# Patient Record
Sex: Male | Born: 1950 | Race: White | Hispanic: No | Marital: Married | Smoking: Current every day smoker
Health system: Southern US, Community
[De-identification: ages and names within clinical notes are randomized; demographics above are authoritative.]

## PROBLEM LIST (undated history)

## (undated) DIAGNOSIS — C801 Malignant (primary) neoplasm, unspecified: Secondary | ICD-10-CM

## (undated) DIAGNOSIS — N200 Calculus of kidney: Secondary | ICD-10-CM

## (undated) DIAGNOSIS — I10 Essential (primary) hypertension: Secondary | ICD-10-CM

## (undated) DIAGNOSIS — I252 Old myocardial infarction: Secondary | ICD-10-CM

## (undated) HISTORY — PX: CORONARY ANGIOPLASTY WITH STENT PLACEMENT: SHX49

## (undated) HISTORY — PX: PARATHYROIDECTOMY: SHX19

---

## 2016-12-12 ENCOUNTER — Emergency Department
Admission: EM | Admit: 2016-12-12 | Discharge: 2016-12-12 | Disposition: A | Discharge status: Home / Self Care | Payer: Medicare Other | Attending: Emergency Medicine | Admitting: Emergency Medicine

## 2016-12-12 ENCOUNTER — Encounter: Payer: Self-pay | Admitting: Emergency Medicine

## 2016-12-12 ENCOUNTER — Emergency Department: Payer: Medicare Other

## 2016-12-12 DIAGNOSIS — Z85820 Personal history of malignant melanoma of skin: Secondary | ICD-10-CM | POA: Insufficient documentation

## 2016-12-12 DIAGNOSIS — I252 Old myocardial infarction: Secondary | ICD-10-CM | POA: Insufficient documentation

## 2016-12-12 DIAGNOSIS — F17210 Nicotine dependence, cigarettes, uncomplicated: Secondary | ICD-10-CM | POA: Insufficient documentation

## 2016-12-12 DIAGNOSIS — N23 Unspecified renal colic: Secondary | ICD-10-CM

## 2016-12-12 DIAGNOSIS — Z85028 Personal history of other malignant neoplasm of stomach: Secondary | ICD-10-CM | POA: Diagnosis not present

## 2016-12-12 DIAGNOSIS — I10 Essential (primary) hypertension: Secondary | ICD-10-CM | POA: Diagnosis not present

## 2016-12-12 DIAGNOSIS — R1032 Left lower quadrant pain: Secondary | ICD-10-CM | POA: Diagnosis present

## 2016-12-12 HISTORY — DX: Essential (primary) hypertension: I10

## 2016-12-12 HISTORY — DX: Calculus of kidney: N20.0

## 2016-12-12 HISTORY — DX: Malignant (primary) neoplasm, unspecified: C80.1

## 2016-12-12 HISTORY — DX: Old myocardial infarction: I25.2

## 2016-12-12 LAB — URINALYSIS, COMPLETE (UACMP) WITH MICROSCOPIC
Bilirubin Urine: NEGATIVE
Glucose, UA: NEGATIVE mg/dL
Ketones, ur: NEGATIVE mg/dL
LEUKOCYTES UA: NEGATIVE
Nitrite: NEGATIVE
PROTEIN: NEGATIVE mg/dL
SPECIFIC GRAVITY, URINE: 1.015 (ref 1.005–1.030)
pH: 6 (ref 5.0–8.0)

## 2016-12-12 LAB — COMPREHENSIVE METABOLIC PANEL
ALK PHOS: 68 U/L (ref 38–126)
ALT: 19 U/L (ref 17–63)
AST: 20 U/L (ref 15–41)
Albumin: 4 g/dL (ref 3.5–5.0)
Anion gap: 5 (ref 5–15)
BUN: 17 mg/dL (ref 6–20)
CALCIUM: 8.8 mg/dL — AB (ref 8.9–10.3)
CHLORIDE: 108 mmol/L (ref 101–111)
CO2: 26 mmol/L (ref 22–32)
CREATININE: 0.86 mg/dL (ref 0.61–1.24)
GFR calc non Af Amer: >60 mL/min (ref >60)
GLUCOSE: 118 mg/dL — AB (ref 65–99)
Potassium: 4 mmol/L (ref 3.5–5.1)
SODIUM: 139 mmol/L (ref 135–145)
Total Bilirubin: 0.8 mg/dL (ref 0.3–1.2)
Total Protein: 6.6 g/dL (ref 6.5–8.1)

## 2016-12-12 LAB — CBC
HEMATOCRIT: 51.1 % (ref 40.0–52.0)
Hemoglobin: 17.3 g/dL (ref 13.0–18.0)
MCH: 29.6 pg (ref 26.0–34.0)
MCHC: 34 g/dL (ref 32.0–36.0)
MCV: 87.1 fL (ref 80.0–100.0)
PLATELETS: 224 10*3/uL (ref 150–440)
RBC: 5.86 MIL/uL (ref 4.40–5.90)
RDW: 14.5 % (ref 11.5–14.5)
WBC: 7.2 10*3/uL (ref 3.8–10.6)

## 2016-12-12 MED ORDER — TAMSULOSIN HCL 0.4 MG PO CAPS: 0.4000 mg | ORAL_CAPSULE | Freq: Every day | ORAL | 0 refills | Status: AC

## 2016-12-12 MED ORDER — OXYCODONE-ACETAMINOPHEN 7.5-325 MG PO TABS: 1.0000 | ORAL_TABLET | ORAL | 0 refills | Status: AC | PRN

## 2016-12-12 MED ORDER — ONDANSETRON 4 MG PO TBDP: 4.0000 mg | ORAL_TABLET | Freq: Three times a day (TID) | ORAL | 0 refills | Status: AC | PRN

## 2016-12-12 MED ORDER — MORPHINE SULFATE (PF) 4 MG/ML IV SOLN
4.0000 mg | Freq: Once | INTRAVENOUS | Status: AC
  Administered 2016-12-12: 4 mg via INTRAVENOUS
  Filled 2016-12-12: qty 1

## 2016-12-12 MED ORDER — KETOROLAC TROMETHAMINE 30 MG/ML IJ SOLN
15.0000 mg | Freq: Once | INTRAMUSCULAR | Status: AC
  Administered 2016-12-12: 15 mg via INTRAVENOUS
  Filled 2016-12-12: qty 1

## 2016-12-12 MED ORDER — DOCUSATE SODIUM 100 MG PO CAPS: 100.0000 mg | ORAL_CAPSULE | Freq: Every day | ORAL | 2 refills | Status: AC | PRN

## 2016-12-12 NOTE — ED Notes (Signed)
First Nurse: pt ambulatory to stat desk with c/o not feeling well and not able to have a BM in one week. Pt also states has a kidney stone currently.

## 2016-12-12 NOTE — ED Triage Notes (Signed)
States L flank pain x 5 days. Has sensation of pressure to urinate constantly. States also decreased bowel movements x 1 week with feeling of distention. Denies fever. Nausea without vomiting.

## 2016-12-12 NOTE — ED Notes (Signed)
Patient transported to X-ray 

## 2016-12-12 NOTE — ED Provider Notes (Addendum)
[facility] Emergency Department Provider Note       Time seen: ----------------------------------------- 9:27 AM on 12/12/2016 -----------------------------------------     I have reviewed the triage vital signs and the nursing notes.   HISTORY   Chief Complaint Flank Pain    HPI Bobby Evans is a 66 y.o. male who presents to the ED for left sided back pain for the last 5 days. Patient has had a sensation of pressure to urinate constantly with decreased bowel movements over the last week as well. Patient feels distended in the abdomen, he denies fevers, chills, chest pain, shortness of breath, vomiting or diarrhea. He has had nausea.   Past Medical History:  Diagnosis Date  . Cancer ([place])    stomache cancer and melanoma  . Hypertension   . Kidney stones   . MI, old     There are no active problems to display for this patient.   Past Surgical History:  Procedure Laterality Date  . CORONARY ANGIOPLASTY WITH STENT PLACEMENT    . PARATHYROIDECTOMY      Allergies Keflex [cephalexin]; Codeine; and Protonix [pantoprazole sodium]  Social History Social History  Substance Use Topics  . Smoking status: Current Every Day Smoker    Packs/day: 0.50    Types: Cigarettes  . Smokeless tobacco: Not on file  . Alcohol use Not on file    Review of Systems Constitutional: Negative for fever. Cardiovascular: Negative for chest pain. Respiratory: Negative for shortness of breath. Gastrointestinal: Positive for abdominal pain, nausea Genitourinary: Negative for dysuria. Musculoskeletal: Negative for back pain. Skin: Negative for rash. Neurological: Negative for headaches, focal weakness or numbness.  All systems negative/normal/unremarkable except as stated in the HPI  ____________________________________________   PHYSICAL EXAM:  VITAL SIGNS: ED Triage Vitals  Enc Vitals Group     BP 12/12/16 0821 (!) 157/92     Pulse Rate 12/12/16  0821 74     Resp 12/12/16 0821 20     Temp 12/12/16 0821 97.7 F (36.5 C)     Temp Source 12/12/16 0821 Oral     SpO2 12/12/16 0821 97 %     Weight 12/12/16 0822 165 lb (74.8 kg)     Height 12/12/16 0822 5\' 6"  (1.676 m)     Head Circumference --      Peak Flow --      Pain Score 12/12/16 0821 8     Pain Loc --      Pain Edu? --      Excl. in [place]? --     Constitutional: Alert and oriented. Well appearing and in no distress. Eyes: Conjunctivae are normal. Normal extraocular movements. ENT   Head: Normocephalic and atraumatic.   Nose: No congestion/rhinnorhea.   Mouth/Throat: Mucous membranes are moist.   Neck: No stridor. Cardiovascular: Normal rate, regular rhythm. No murmurs, rubs, or gallops. Respiratory: Normal respiratory effort without tachypnea nor retractions. Breath sounds are clear and equal bilaterally. No wheezes/rales/rhonchi. Gastrointestinal: Left flank tenderness, no rebound or guarding. Normal bowel sounds. Musculoskeletal: Nontender with normal range of motion in extremities. No lower extremity tenderness nor edema. Neurologic:  Normal speech and language. No gross focal neurologic deficits are appreciated.  Skin:  Skin is warm, dry and intact. No rash noted. Psychiatric: Mood and affect are normal. Speech and behavior are normal.  ____________________________________________  ED COURSE:  Pertinent labs & imaging results that were available during my care of the patient were reviewed by me and considered in my medical  decision making (see chart for details). Patient presents for flank pain and likely renal colic, we will assess with labs and imaging as indicated.   Procedures ____________________________________________   LABS (pertinent positives/negatives)  Labs Reviewed  URINALYSIS, COMPLETE (UACMP) WITH MICROSCOPIC - Abnormal; Notable for the following:       Result Value   Color, Urine YELLOW (*)    APPearance HAZY (*)    Hgb urine  dipstick MODERATE (*)    Bacteria, UA RARE (*)    Squamous Epithelial / LPF 0-5 (*)    All other components within normal limits  COMPREHENSIVE METABOLIC PANEL - Abnormal; Notable for the following:    Glucose, Bld 118 (*)    Calcium 8.8 (*)    All other components within normal limits  CBC    RADIOLOGY Images were viewed by me  Ultrasound renal, abdomen 2 view IMPRESSION: Moderate left hydroureteronephrosis. 8 mm stone previously seen by radiography not seen by the ultrasound technologist. No left ureteral jet. Findings consistent with obstructing left ureteral stone. Specific location not determined.  No hydronephrosis on the right. Possible 3 mm nonobstructing stone midportion. ____________________________________________  FINAL ASSESSMENT AND PLAN  Renal colic  Plan: Patient's labs and imaging were dictated above. Patient had presented for Flank pain which is apparently from likely obstructing distal ureteral stone. I have discussed with urology who feels like the pain is coming from a distal left stone. He'll be discharged with Flomax, pain medicine and antiemetics.   Earleen Newport, MD   Note: This note was generated in part or whole with voice recognition software. Voice recognition is usually quite accurate but there are transcription errors that can and very often do occur. I apologize for any typographical errors that were not detected and corrected.     Earleen Newport, MD 12/12/16 1105    Earleen Newport, MD 12/12/16 [phone]

## 2018-12-01 IMAGING — CR DG ABDOMEN 2V
2 series · 2 of 2 positions shown · non-contrast
Comparison: No recent .

CLINICAL DATA: Left flank pain.

EXAM:
ABDOMEN - 2 VIEW

[abdomen erect]
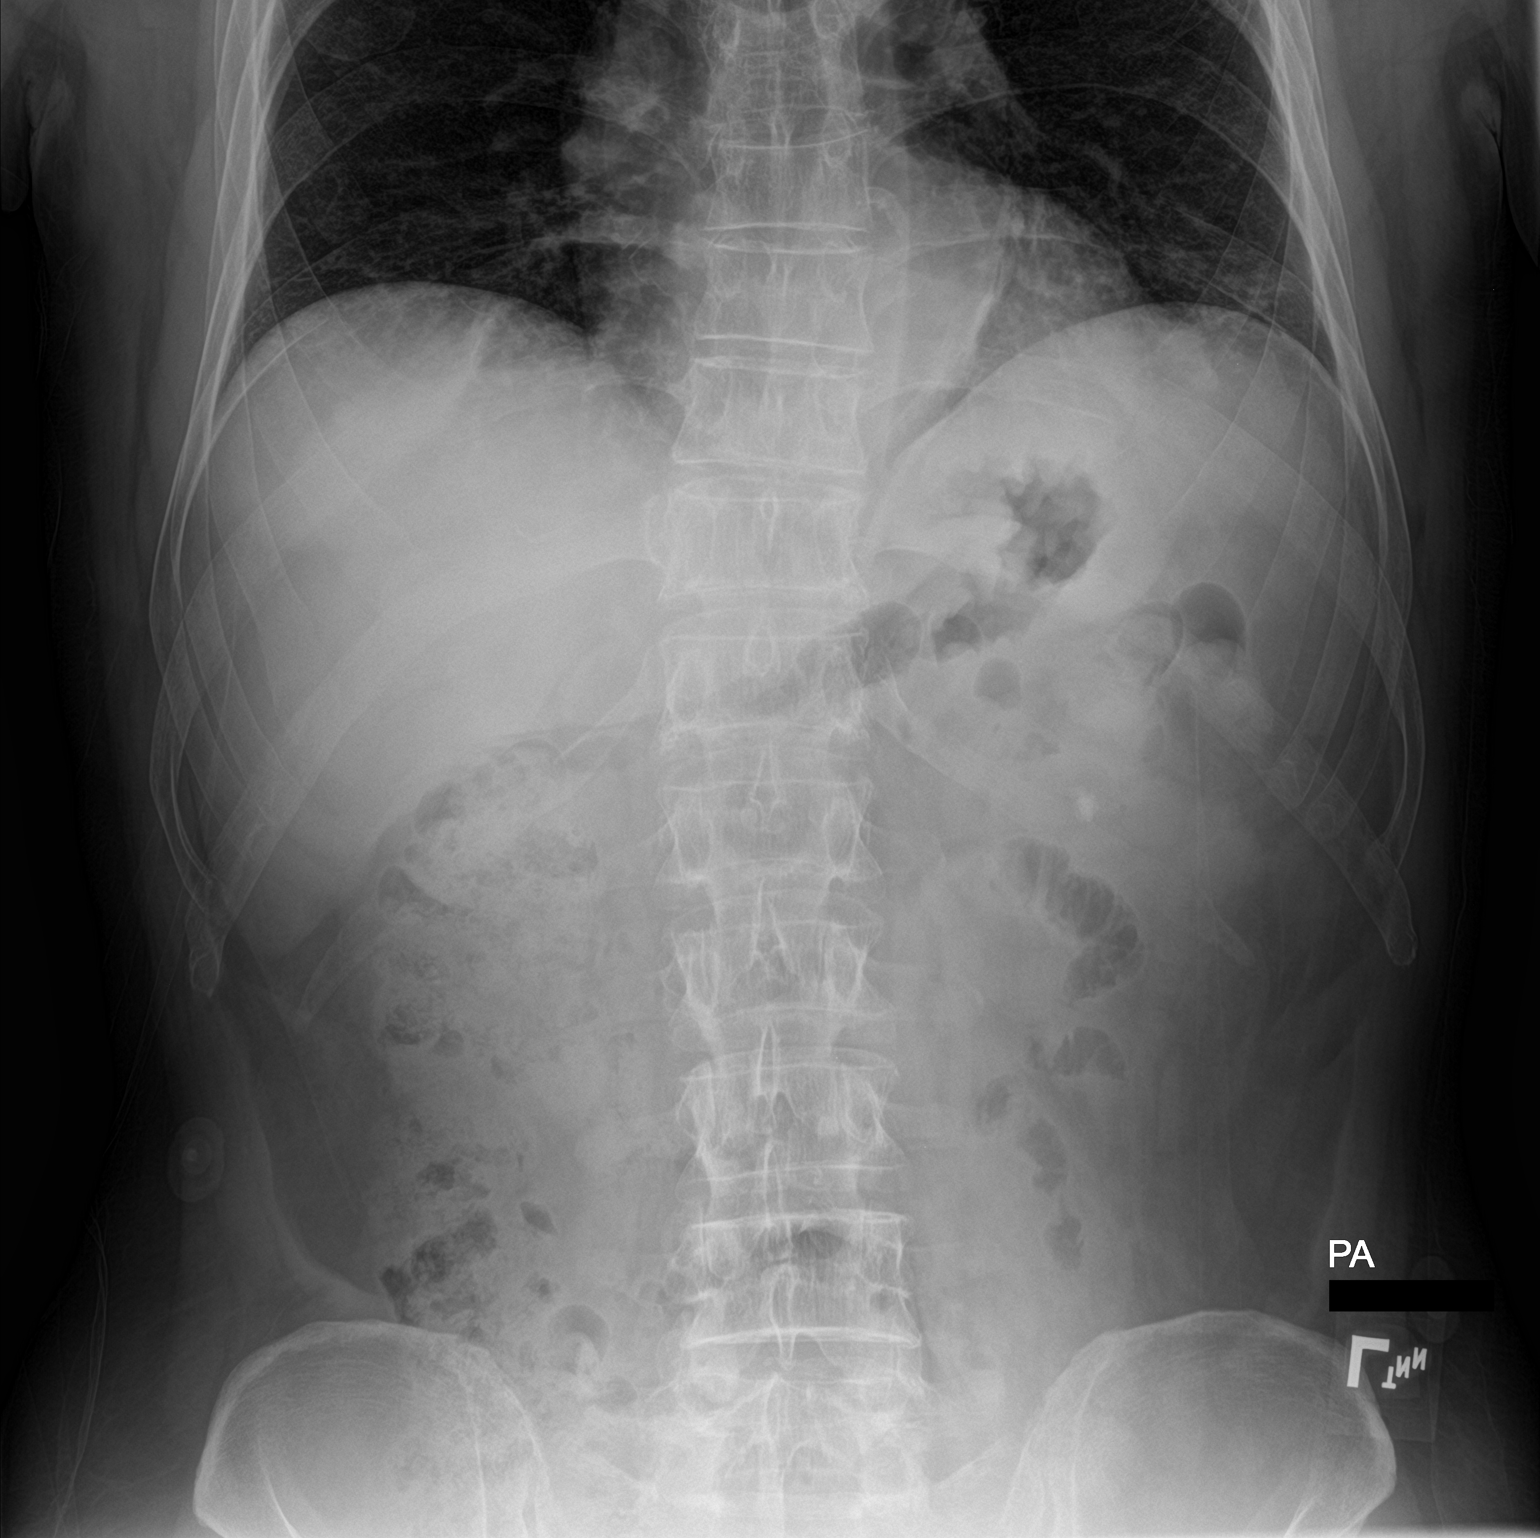

[abdomen supine]
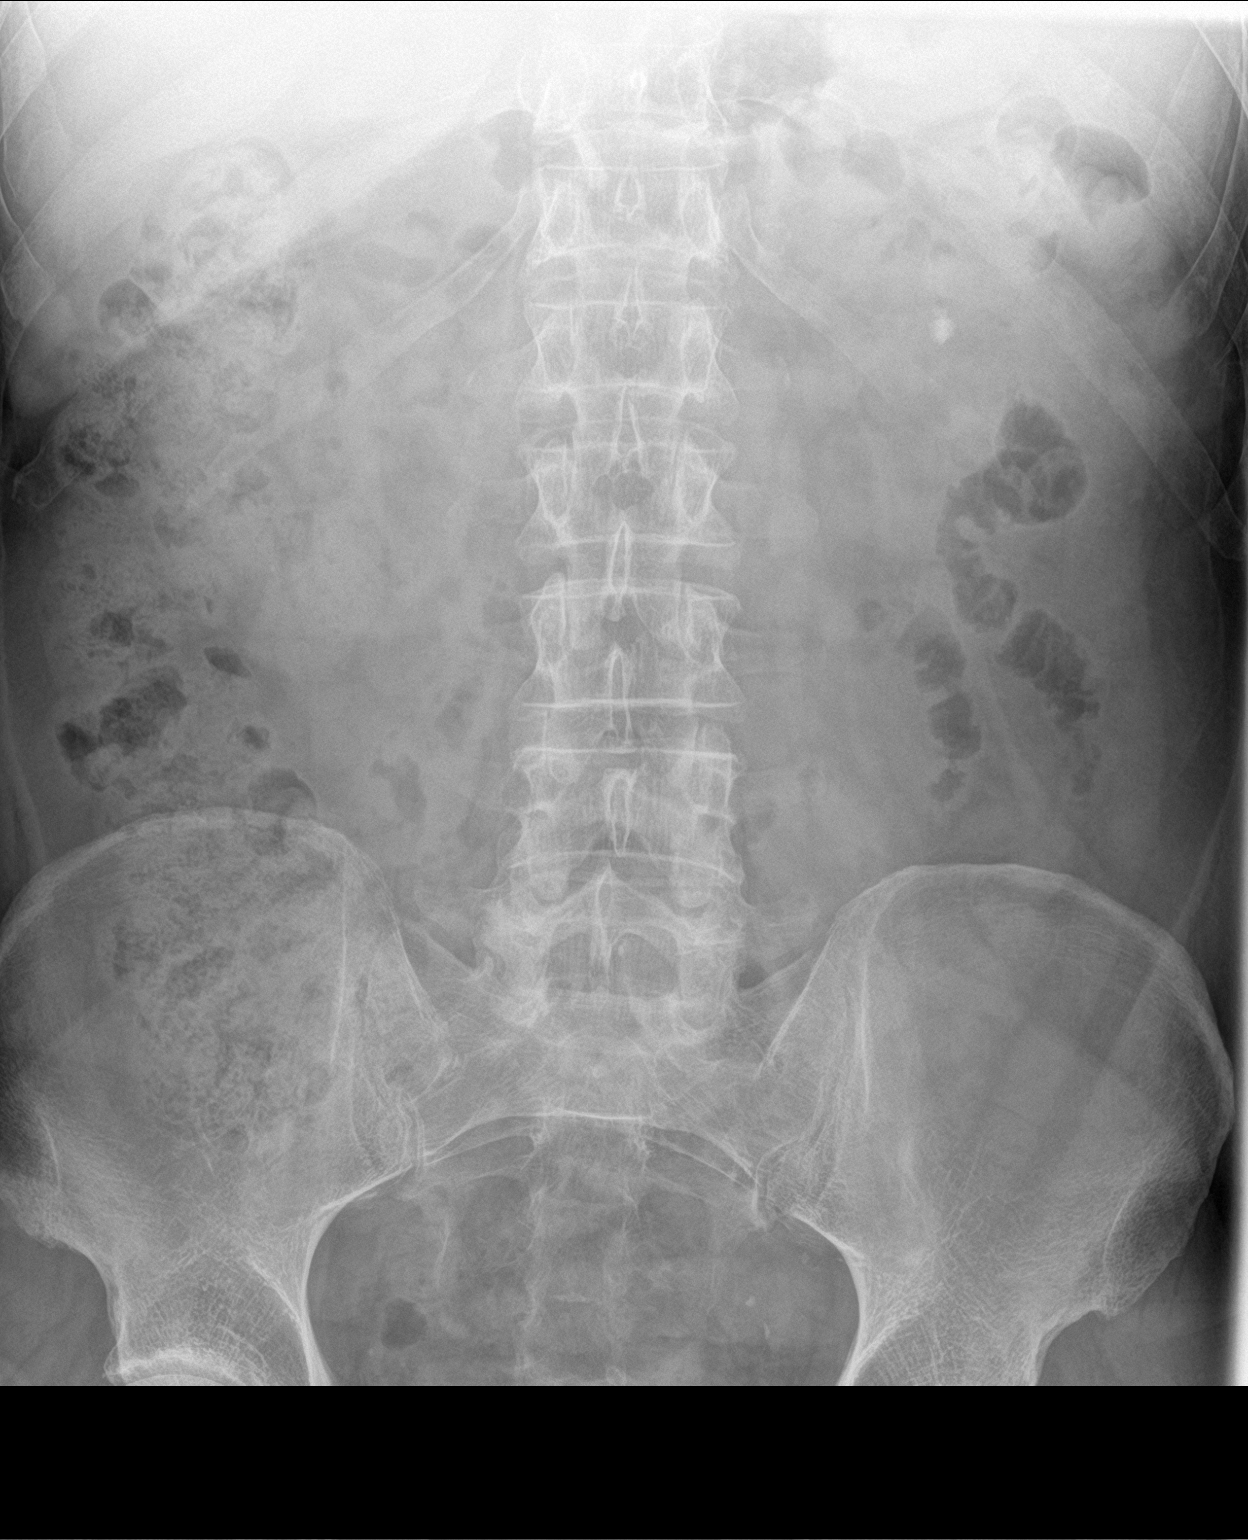

[2 of 2 positions shown; findings below may reference images not displayed]

FINDINGS: Soft tissue structures are unremarkable. 8 mm is calcifications left
upper abdomen consistent left nephrolithiasis . Multiple
calcifications in the pelvis. Although these may represent
phleboliths a distal ureteral stone cannot be excluded. No bowel
distention. Stool noted throughout the colon.
IMPRESSION: 1. 8 mm left nephrolithiasis. Multiple calcifications in the pelvis.
Although these may represent phleboliths a distal ureteral stone
cannot be excluded.

2. No acute or focal abnormality otherwise noted.

## 2019-01-18 IMAGING — US US RENAL
1 series · 14 of 25 positions shown · non-contrast
Comparison: Abdominal radiography same day

CLINICAL DATA: Left flank pain over the last 5 days. History of
kidney stones with lithotripsy.

EXAM:
RENAL / URINARY TRACT ULTRASOUND COMPLETE

[Series 1: us renal · 0.23mm/px · 14 of 58 slices shown]
[im 1/58]
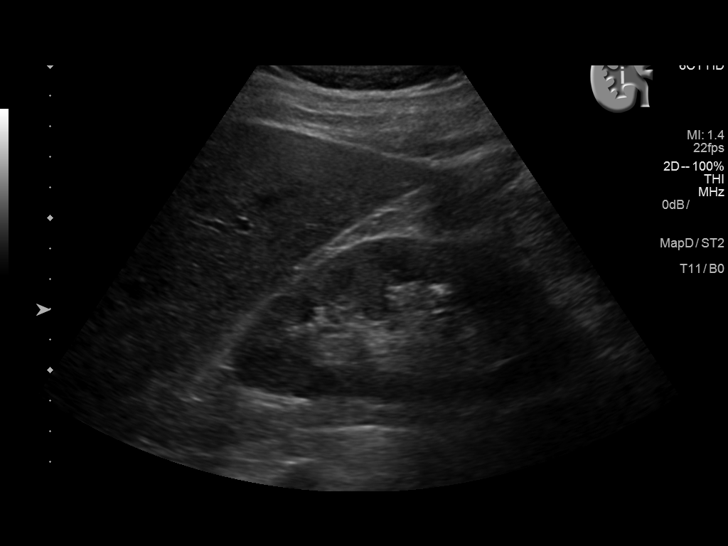
[im 5/58]
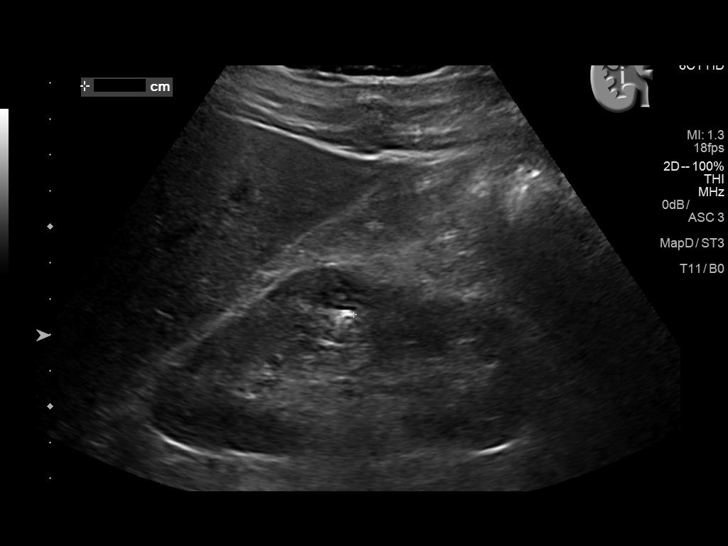
[im 10/58]
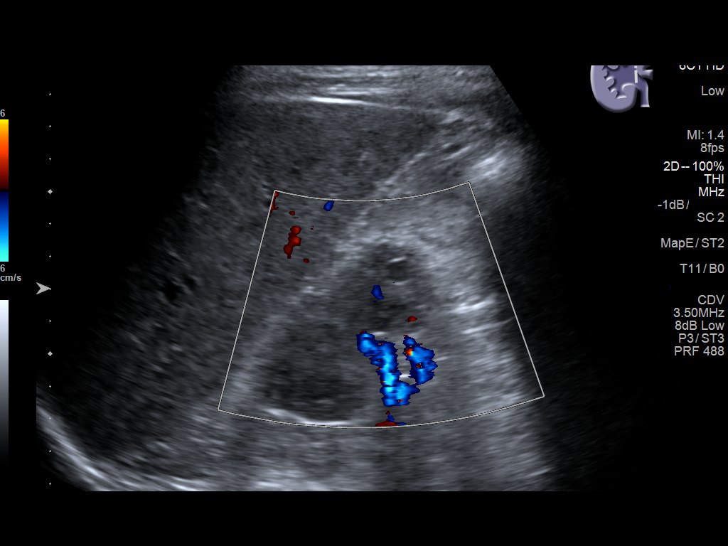
[im 15/58]
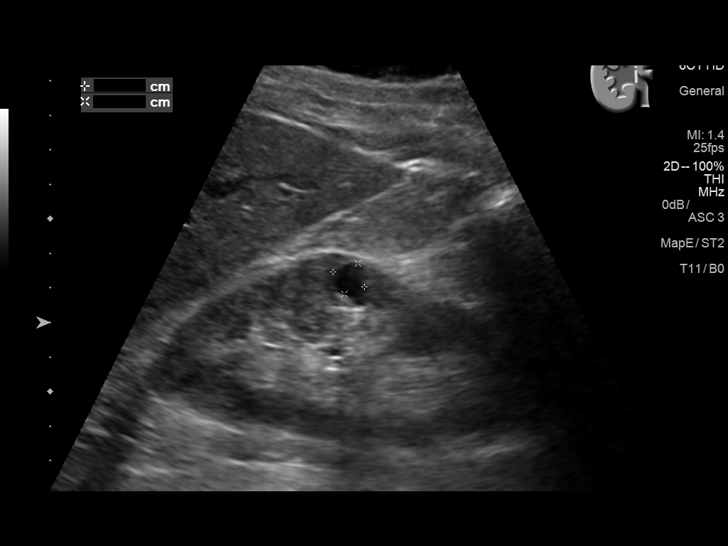
[im 20/58]
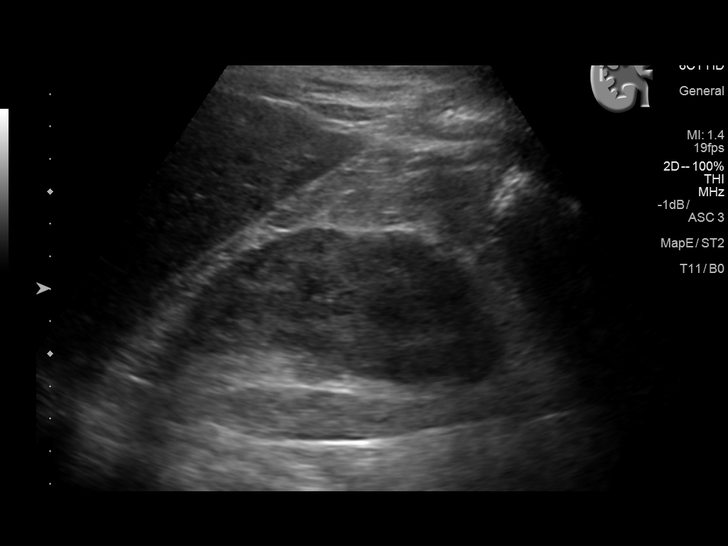
[im 22/58]
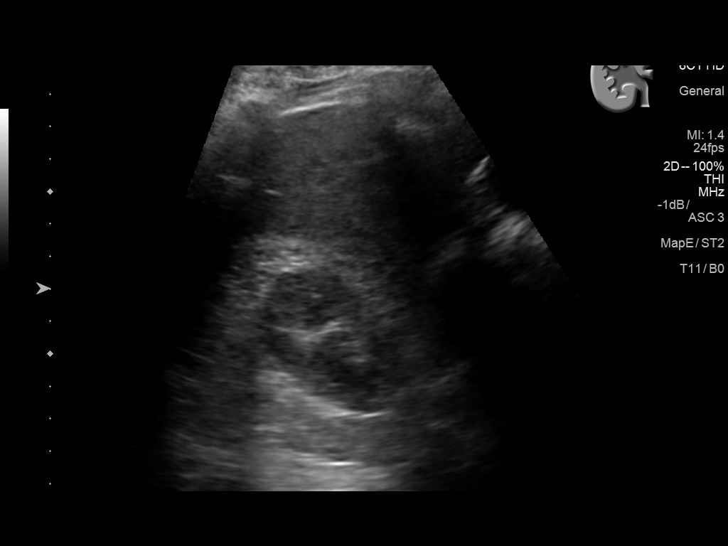
[im 27/58]
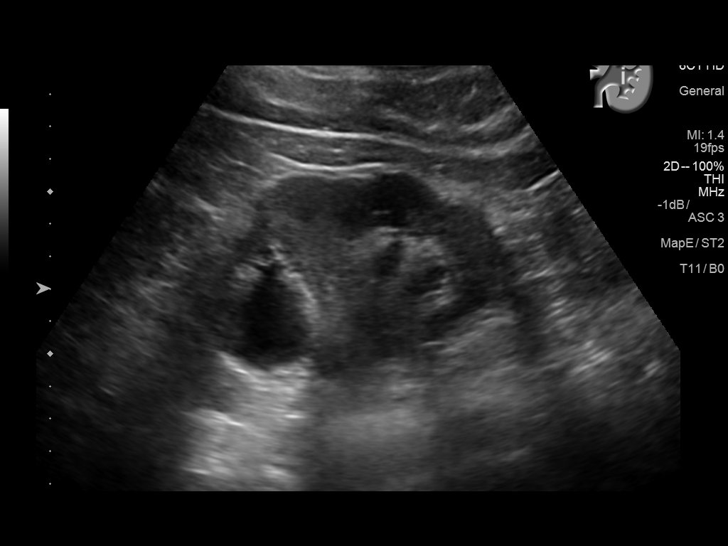
[im 31/58]
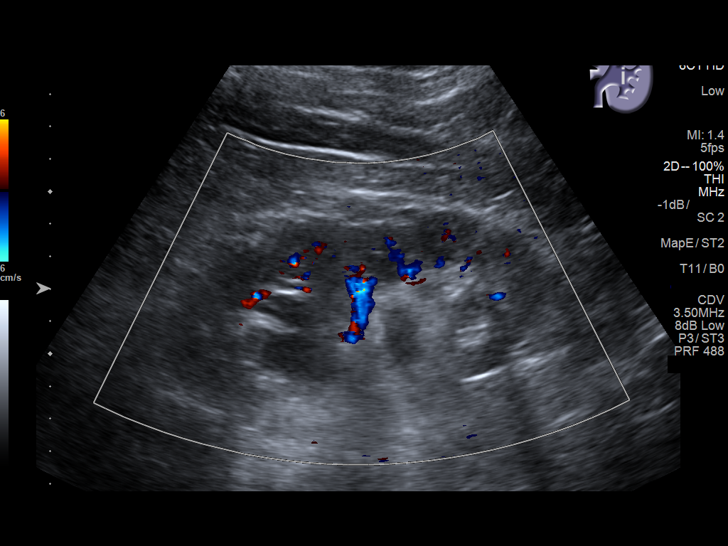
[im 36/58]
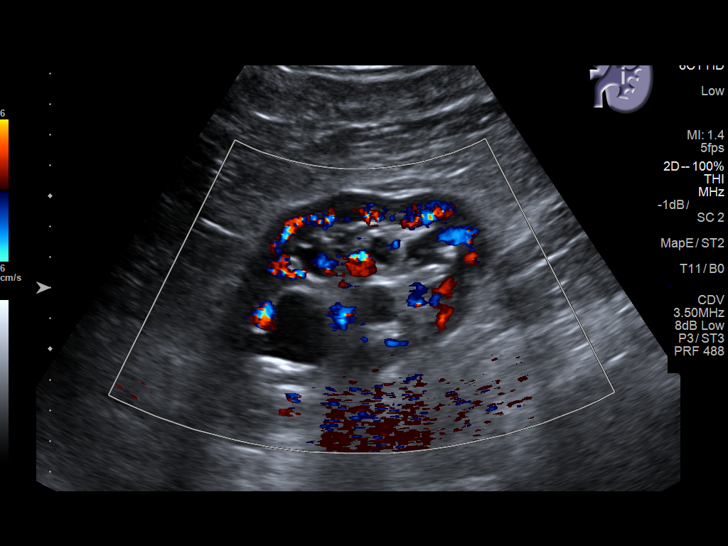
[im 39/58]
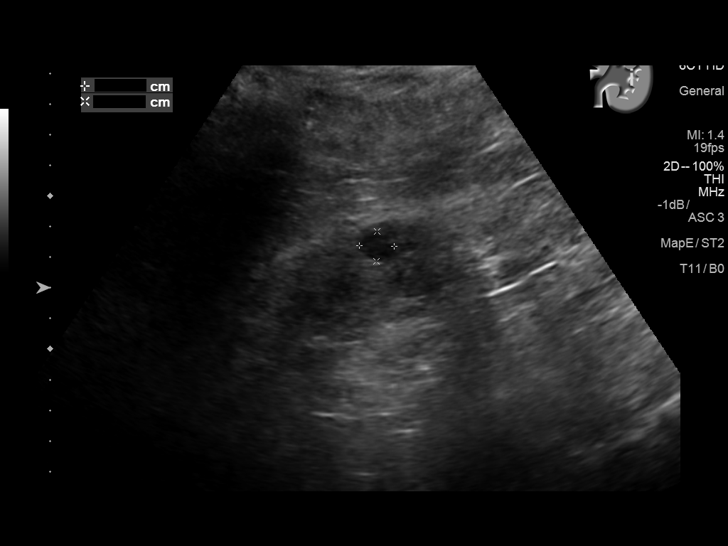
[im 43/58]
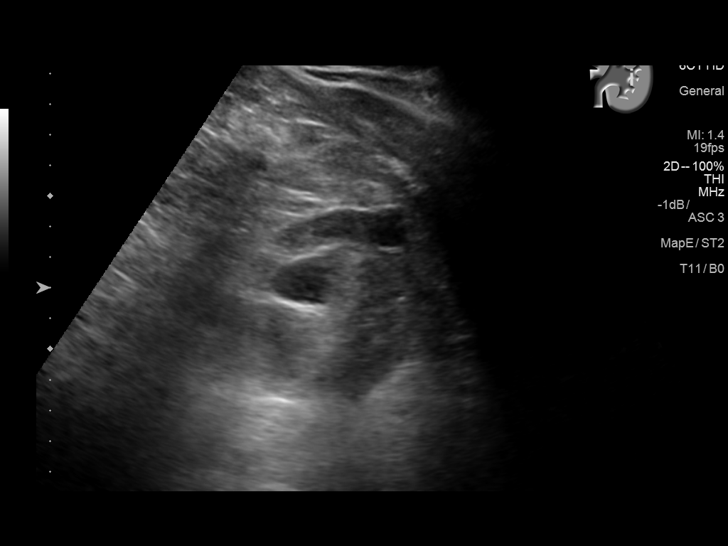
[im 48/58]
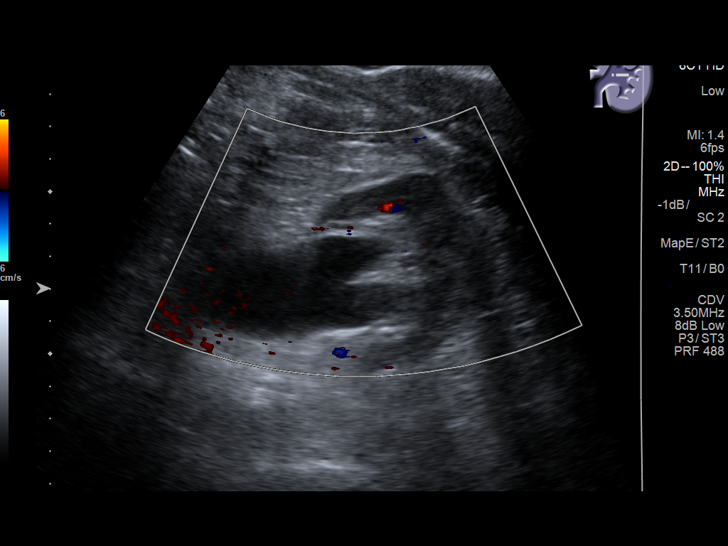
[im 53/58]
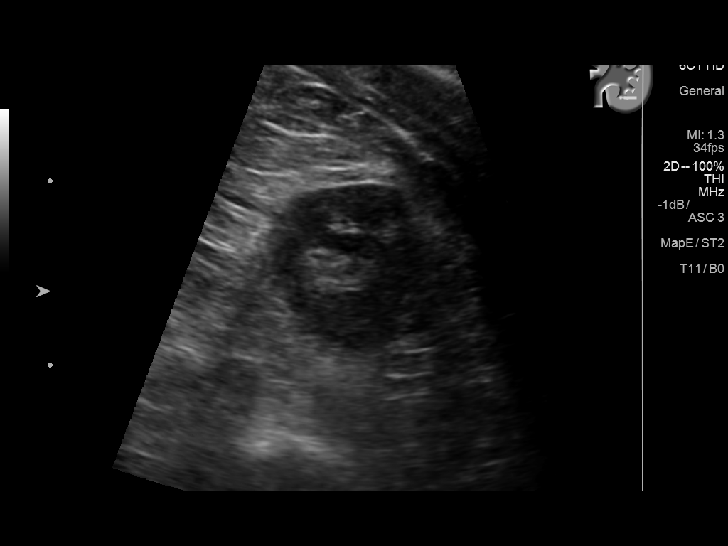
[im 58/58]
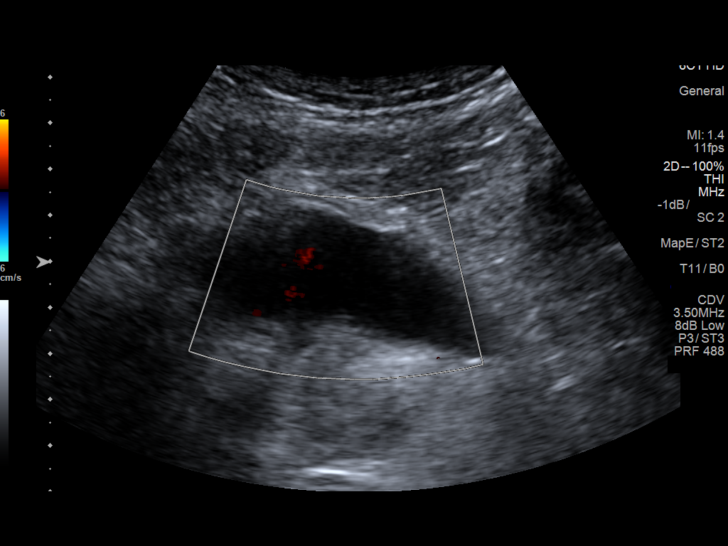

[14 of 25 positions shown; findings below may reference images not displayed]

FINDINGS: Right Kidney:

Length: 11.8 cm.. Suspicion of 3 mm stone in the midportion. No
hydronephrosis. Two small benign appearing cysts.

Left Kidney:

Length: 11.5 cm. Moderate left hydroureteronephrosis. Technologist
did not identified the previously seen 8 mm left renal stone.
Incidental 1 cm cyst.

Bladder:

Normal right ureteral jet.  No left ureteral jet.
IMPRESSION: Moderate left hydroureteronephrosis. 8 mm stone previously seen by
radiography not seen by the ultrasound technologist. No left
ureteral jet. Findings consistent with obstructing left ureteral
stone. Specific location not determined.

No hydronephrosis on the right. Possible 3 mm nonobstructing stone
midportion.

## 2022-05-26 NOTE — Progress Notes (Signed)
 [facility] PATIENT VISIT  Date of Encounter: 05/26/2022 Patient Name: Bobby Evans 04-09-1951 71 y.o.  Primary Care Provider:  Gary Levander Shaggy, MD Primary Cardiologist: Dr. Vannie  Reason for visit: Bobby Evans  is a 71 y.o. male seen today in clinic for intermittent chest pain  History of Present Illness Bobby Evans is a 71 y.o. male with pmhx significant for CAD, HTN.  Pt was last evaluated by Dr. Vannie 04/07/2022.  He presents to clinic today with concerns of intermittent chest pain described as left sided, then bilateral chest pain, rating it at a 5-6/10 at its worst, that began on Saturday.  He reported that morning he awoke feeling off. Later that day, he began experiencing chest discomfort.  He states his symptoms continued on Sunday, taking Mylanta, without improvement, but lying flat to rest improved his symptoms.  He also noted upper posterior shoulder area tightness, but denies tenderness to touch.  Monday his symptoms again worsened throughout the day.  He went grocery shopping and while walking around, he became lightheaded.  He returned home, noting his blood pressure was low 93/32mmHg. His symptoms improved with lying down. Upon waking Wednesday, his symptoms improved, but continued to experience intermittent chest pain.  Today, he overall feels well.   He did not engage in routine activity, like he normally does, but he didn't feel well.  He reported another brief episode of chest pain on his drive here, that self resolved.  Denies further episodes of lightheadedness.  Denies recent illnesses. He denies shortness of breath/DOE. He denies active chest pain/pressure/tightness.    Denies orthopnea/PND, pedal edema, palpitations, further episodes of lightheadedness or dizziness, or syncope/near syncope, fevers, chills, muscle aches, nausea, vomiting. EKG and echo completed today, echo pending.  EKG demosntrated LAFB, NSR, as fully outlined below.      Accessory Clinical Findings  I independently reviewed last cardiology note.  Past Medical History  Past Medical History:  Diagnosis Date  . Aneurysm, ascending aorta (CMS-HCC)   . Anxiety   . Cancer (CMS-HCC) 2013 & 2014  . Diabetes mellitus (CMS-HCC)   . Disease of thyroid gland   . GERD (gastroesophageal reflux disease)   . Hyperlipidemia   . Hypertension   . Mini stroke   . Myocardial infarction (CMS-HCC)   . Peptic ulceration   . Stroke (CMS-HCC)    Past Surgical History:  Procedure Laterality Date  . COLONOSCOPY    . CORONARY STENT PLACEMENT    . FRACTURE SURGERY    . kidney stones    . KNEE SURGERY Bilateral   . parathyroid disease    . right hand reconstruction    . stomach cancer    . thyroid nodules    . US  CYST ASPIR OF KIDNEY BILAT ([facility] HISTORICAL RESULT)    . widow maker      Current Allergy List Allergies  Allergen Reactions  . Aspirin Hives and Nausea And Vomiting    Large   doses of aspirin cause severe GERD. Large   doses of aspirin cause severe GERD. NON COATED  . Protonix [Pantoprazole] Hives  . Chlorthalidone Itching  . Codeine     hyperactivity  . Eliquis [Apixaban]   . Keflex [Cephalexin]   . Lactose   . Pollen Extracts     Other reaction(s): Unknown    Current Medication List Current Outpatient Medications  Medication Sig Dispense Refill  . ALPRAZolam (XANAX) 0.5 MG tablet Take 1 tablet (0.5 mg total) by mouth Three (  3) times a day as needed. Pt taking 1/2 QAM    . aspirin (ECOTRIN) 81 MG tablet Take 1 tablet (81 mg total) by mouth daily.    SABRA atorvastatin (LIPITOR) 80 MG tablet TAKE 1 TABLET BY MOUTH DAILY (Patient taking differently: 1 tablet (80 mg total)  in the morning.) 90 tablet 1  . famotidine (PEPCID) 40 MG tablet Take 1 tablet (40 mg total) by mouth nightly as needed.    . fluticasone propionate (FLONASE) 50 mcg/actuation nasal spray use TWO SPRAYS in BOTH nostrils ONCE a DAY    . HYDROmorphone (DILAUDID) 2 MG tablet  PRN    . KRILL OIL ORAL Take by mouth.    . losartan (COZAAR) 25 MG tablet Take 1 tablet (25 mg total) by mouth Two (2) times a day. 180 tablet 3  . metoPROLOL succinate (TOPROL XL) 50 MG 24 hr tablet Take 1 tablet (50 mg total) by mouth every morning. 30 tablet 6  . metoprolol tartrate (LOPRESSOR) 25 MG tablet 25 mg as needed for pvcs    . nitroglycerin (NITROSTAT) 0.4 MG SL tablet Place 1 tablet (0.4 mg total) under the tongue every five (5) minutes as needed for chest pain.    . RABEprazole (ACIPHEX) 20 mg tablet Take 1 tablet (20 mg total) by mouth daily.    . rivaroxaban (XARELTO) 10 mg tablet Take 1 tablet (10 mg total) by mouth daily.    . sucralfate (CARAFATE) 1 gram tablet Take 1 tablet (1 g total) by mouth four (4) times a day. PRN    . tamsulosin  (FLOMAX ) 0.4 mg capsule take 1 capsule daily FOR 7 DAYS    . verapamil HCl (VERAPAMIL ORAL) Take 120 mg by mouth nightly. ER    . azelastine (ASTELIN) 137 mcg (0.1 %) nasal spray administer 2 SPRAYS into each nostril ONCE a DAY    . coenzyme Q10 100 mg capsule Take 2 capsules (200 mg total) by mouth daily.    . ondansetron  (ZOFRAN ) 4 MG tablet ondansetron  HCl 4 mg tablet  TAKE ONE TABLET BY MOUTH THREE TIMES DAILY    . potassium citrate 15 mEq TbER TAKE ONE TABLET ONCE DAILY     No current facility-administered medications for this visit.    Family History  Family History  Problem Relation Age of Onset  . Breast cancer Mother   . Cancer Mother   . Diabetes Mother   . Heart attack Father   . Arthritis Father   . Heart disease Father   . Hyperlipidemia Father   . Hypertension Father   . Vision loss Father   . Angina Father   . GER disease Father   . Heart disease Brother   . Hyperlipidemia Brother   . Hypertension Brother   . Angina Brother   . GER disease Brother   . Hyperlipidemia Sister   . GER disease Sister   . Hyperlipidemia Brother   . GER disease Sister   . Hypertension Sister   . Cancer Sister   . Breast cancer  Child     Social History  Social History   Socioeconomic History  . Marital status: Married    Spouse name: Louanne Hannan   . Number of children: 2  . Years of education: post graduate   Occupational History  . Occupation: retired  Tobacco Use  . Smoking status: Heavy Smoker    Current packs/day: 0.50    Average packs/day: 0.5 packs/day for 50.0 years (25.0 ttl pk-yrs)  Types: Cigarettes  . Smokeless tobacco: Never  . Tobacco comments:    half pack daily  Substance and Sexual Activity  . Alcohol use: Never    Alcohol/week: 1.0 standard drink of alcohol    Types: 1 Shots of liquor per week    Comment: socially  . Drug use: Never  . Sexual activity: Yes    Partners: Female    Review of Systems Except as noted in the history of present of illness, all other systems are negative.  Physical Exam Blood pressure 118/76, pulse 65, resp. rate 20, height 170.2 cm (5' 7), weight 72.6 kg (160 lb), SpO2 95 %.  Constitutional: Well developed, well nourished, in no acute distress Neurologic/ Psychiatric: Alert and oriented X 3. Mood and affect appropriate. HEENT: Normocephalic, atraumatic, carotids without bruit or thrill Respiratory: CTAB, diminished bases, no wheeze/rhonchi/crackles Cardiovascular: RRR, S1S2  Extremities and distal pulses: No peripheral edema.  2+ radial/DP and symmetrical upper extremities GI: s/nt/nd, BS (+), no obvious masses Musculoskeletal: Muscle strength and tone normal. No atrophy or abnormal movements noted. Skin: Skin is warm, and without edema, discoloration, or lesions  Hematologic/Lymphatic/Immunologic: No signs of bleeding or excessive bruising.   ASSESSMENT/PLAN  CAD:  h/o PCI to LAD completed in CONN Low risk myocardial in 08/2018 CTA 04/2020.  Last nuclear stress as outlined below Intermittent chest discomfort. EKG as outlined below  Discussed potential options with the patient. Previously had negative stress test and proceeded with  cath that resulted in PCI to the LAD.  Pt is not a candidate at this time to add Imdur secondary to blood pressure.  Pt wishes to proceed with LHC Reviewed ER precautions.    The cardiac catheterization and possible percutaneous intervention procedure was explained to the patient including the nature, benefits, possible alternative methods of treatment, diagnosis, and the risk of injury/complications of the procedures. Serious non-fatal complications, such as stroke or heart attack, are uncommon; approximately 2 out of every 1,000 cases. The risk of death is 1 out of every 10,000 elective cases.  Other complications that may develop are, but are not limited to, bleeding that may require transfusion, infection, abnormal heart rhythm, rupture or blockage vessel, heart failure, kidney damage, dye reaction, blood clots. Complications could require emergency coronary bypass surgery for treatment. Patient verbalized understanding to the risk vs. benefits. Questions encouraged and answered. Pt agreeable and willing to proceed.     Hx of PE - Continues on Xarelto   H/o PVC/VT:  Followed by Dr. Alaina Last evaluated 12/31/2021   Dyslipidemia:  - Lipid panel 06/12/2020 - TC 187, Tri 171, HDL 33, LDL 149 - No recent lipid panel on file  - Continues on Atorvastatin 80mg  daily      HTN:  - BP appears well controlled - He reports recent episodes of hypotension at home - Do not recommend Imdur at this time secondary to lower blood pressure - Previously Did not tolerate chlorthalidone hydrochlorothiazide        Insulin resistance: Management as per PCP. Thoracic aortic aneurysm: has been followed by Dr. Verla and prior records show 4.1 cm ascending aorta.    CT 03/2021  1. No acute abdominopelvic process demonstrated.  2. Small bilateral fat-containing indirect inguinal hernias, right greater than left. Bilateral varicoceles which appear fairly symmetric. No pelvic vascular abnormality or  varices.  3. Aortoiliac atherosclerosis and ectasia of infrarenal aorta which measures 2.4 cm. Stable mild fusiform aneurysmal dilatation of included portion of proximal ascending thoracic aorta which measures  4.1 cm.  4. Coronary artery atherosclerosis. Coronary artery stent versus calcifications of the LAD coronary artery.  5. Stable hepatic cysts.  6. Bilateral nephrolithiasis. Multiple nonobstructing stones of both kidneys. Small bilateral renal cysts. Stable mild left renal pyelocaliectasis with no cause for acute obstruction.  7. Mild prostatomegaly. 11 mm hypervascular enhancing nodular focus of central zone prostate. Recommend correlate with PSA.  8. Additional findings and further description as above.    Risk Score:  The 10-yr ASCVD Risk score Verdon DC Jr., et al., 2013) failed to calculate due to the following reason: The 2013 10-yr ASCVD risk score is only valid if the patient does not have prior/existing clinical ASCVD (myocardial infarction, stroke, CABG, coronary angioplasty, angina or peripheral arterial disease, coronary atherosclerosis, ischemic heart disease, or cerebrovascular disease). The patient has prior/existing ASCVD.  Had Coronary Angioplasty Has Angina Has Coronary Atherosclerosis    Recent Cardiac Diagnostics ECG 05/26/2022 NSR, LAFB  ECG: 12/31/2021 NSR, LAD   Echo 05/26/2022  Pending    Nuclear Stress Test 11/13/2019 IMPRESSIONS & RECOMMENDATIONS   Low risk study   No areas of high risk reversible ischemia.   There is a mild in severity and small in size, partially reveresible, but questionable area in the basal inferior septal wall of   possible ischemia vs artifact.   ECG with minor ST changes and excellent exercise capacity.   Normal left ventricular size and ejection fraction.   The ejection fraction post stress is  >55%.   No wall motion abnormalities.    Nuclear Stress 08/21/2018  IMPRESSIONS & RECOMMENDATIONS   Low risk myocardial perfusion  study   There is small in size, mild to moderate in severity, minimally reversible defect in the mid to distal inferior-septal wall consistent   with probable artifact but cannot exxclude small area of low risk ischemia.   Normal left ventricular size and ejection fraction.   Normal left ventricular systolic function.   The ejection fraction post stress is  >55   Echo 08/21/2018 Summary   1. Diastolic dysfunction - grade I (normal filling pressures).   2. Normal left ventricular size and systolic function, ejection fraction 55-60%.   3. Normal right ventricular size and systolic function.   4. Mitral annular calcification.   5. Tricuspid regurgitation - mild.   Disposition: Return to clinic in 4-6 weeks in anticipation of LHC.  Danielle C DiLorenzo, PA, PA-C 05/26/2022, 2:17 PM

## 2023-01-06 NOTE — ED Provider Notes (Signed)
 EMERGENCY MEDICINE  CHIEF COMPLAINT: Chief Complaint  Patient presents with  . Chest Pain    HPI: Bobby Evans is a 32yrs Male who is here complaining of intermittent bouts of sharp stabbing chest pain that started while he was riding on a golf court.  It only lasts for a second or two at a time.  It starts in the left precordial area and goes through to the back.  He says it was very severe and disabling when it first started.  It would recur about every 15 or 20 seconds.  This went on for some time, but now it seems to have eased off and is occurring much less frequently.  He had a couple of milder episodes of it while he was in the lobby waiting to be brought back to room here in the ED.  He is not short of breath with it and the pain itself does not seem to be pleuritic.  He does have a history of CAD but says this feels different from his heart pain.  He had a cardiac catheterization done in late December 2023 which he says was negative and did not require any intervention.  He follows with cardiology at [facility].  He has not had a cough or cold leading up to this.  There is no heavy lifting or strenuous exertion before onset of the symptoms.  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  No accompanying abdominal pain with it and no nausea or vomiting.  Bowel movements and urination have been normal.  No leg pain or swelling.  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  He also has a known aneurysm of the ascending aorta that he says was 4.1 centimeter when last checked.  He does not have any focal neurologic symptoms today to suggest an acute stroke.  PAST MEDICAL HISTORY: Past Medical History:  Diagnosis Date  . Cancer (CMS/HCC)   . Hypertension   . TIA (transient ischemic attack)     PAST SURGICAL HISTORY:  has a past surgical history  that includes carotid stent placement; sur - cardiac; and sur - abdominal surgery shx.  MEDICATIONS: Prior to Admission medications   Medication Sig  ALPRAZolam (XANAX) 0.5 mg Oral Tablet Take 1 Tablet by mouth three times a day as needed for Anxiety. PT STATES HE TAKES 1/2 TAB QAM PRN  aspirin (Aspir-81) 81 mg Oral Tablet, Delayed Release (E.C.) Take 1 Tablet by mouth daily.  atorvastatin (LIPITOR) 80 mg Oral Tablet Take 1 Tablet by mouth at bedtime.  Coenzyme Q10 200 mg Oral Capsule Take 1 Capsule by mouth daily.  famotidine (PEPCID) 40 mg Oral Tablet Take 1 Tablet by mouth twice a day as needed (reflux).  fluticasone propionate (FLONASE) 50 mcg/actuation Nasal Spray, Suspension 1 Spray by Nasal route twice a day as needed for Congestion.  KRILL OIL ORAL Take 1 Tablet by mouth daily.  losartan (COZAAR) 50 mg Oral Tablet Take 0.5 Tablets by mouth twice a day.  metoprolol succinate XL (TOPROL-XL) 50 mg Oral Tablet Sustained Release 24HR Take 1 Tablet by mouth every morning.  nitroglycerin (NITROSTAT) 0.4 mg Sublingual Tablet, Sublingual Take 1 Tablet under tongue every 5 minutes as needed for Chest pain.  potassium citrate (UROCIT-K) 15 mEq Oral Tablet Sustained Release Take 1 Tablet by mouth every evening.  RABEprazole (ACIPHEX) 20 mg Oral Tablet, Delayed Release (E.C.) Take 1 Tablet by mouth every morning.  sucralfate (CARAFATE) 1 gram Oral Tablet Take 1 Tablet by mouth as needed.  verapamil ER (VERELAN) 120 mg Oral Cap, 24HR Sust Release Pellets Take 1 Capsule by mouth every evening.  Xarelto 10 mg Oral Tablet Take 1 Tablet by mouth every evening.     ALLERGIES: Allergies  Allergen Reactions  . Aspirin Hives and Nausea and Vomiting    Large   doses of aspirin cause severe GERD. Large   doses of aspirin cause severe GERD. Large   doses of aspirin cause severe GERD. Large   doses of aspirin cause severe GERD. NON COATED  Other reaction(s): severe stomach problems  . Cephalexin  Anaphylaxis and Rash    Other reaction(s): anaphylactic shock, cephalexin ([id number])  . Other Allergy Diarrhea    Lactose intolerant Lactose intolerant  . Pantoprazole Hives and Rash    Other reaction(s): hives, rash  . Codeine Nausea Only and Rash    Other reaction(s): Other (See Comments), tingly, wired, cant sleep or rest Abdominal pain hyperactivity hyperactivity   . Chlorthalidone Itching  . Eliquis [Apixaban] Rash  . Lactose     Other reaction(s): Abdominal Pain, stomach issues  . Pollen Extracts     Other reaction(s): Unknown    FAMILY HISTORY: Family History  Problem Relation Name Age of Onset  . Breast Cancer Mother      SOCIAL HISTORY: Social History   Tobacco Use  . Smoking status: Every Day    Current packs/day: 0.50    Types: Cigarettes  . Smokeless tobacco: Never  Substance Use Topics  . Alcohol use: Not Currently    REVIEW OF SYSTEMS: No headache, fever, sore throat, cough or cold symptoms, or other coronavirus type symptoms.  Appetite has been OK.  No fall or injury reported.  No heavy lifting or strenuous exertion prior to onset of these symptoms. See HPI for acute CV symptoms. No cough or cold symptoms or difficulty breathing.  This pain does not seem to be pleuritic in nature.  No abdominal pain, nausea, vomiting, or diarrhea. No acute urinary symptoms or hematuria. No leg pain or swelling.  No acute neck or lower back pain. No focal neurological symptoms such as weakness or paresthesias of his face or his extremities that would be suggestive of a stroke.  A full review of systems was completed with the patient and all other systems are negative except as documented above.     PHYSICAL EXAM: Vitals:   01/06/23 1430 01/06/23 1729 01/06/23 1844  BP: 136/84 137/78 128/79  Pulse: 60 58 62  Resp: 18 13 20   Temp: 36.7 C (98.1 F)    SpO2: 98% 98% 95%   Patient is in no acute distress lying back on the gurney with his head elevated.  He is  able to answer questions in full sentences without obvious shortness of breath noted at rest.  His mental status is normal and he is fully cooperative with my exam.  He does not look toxic at this time. No exam evidence of acute head or facial injury.  Neck is supple and without meningismus or JVD.  PERRL/EOMI.  Conjunctivae are clear and sclera are anicteric. Oropharynx is clear with moist mucous membranes.  I do not see exam evidence of an acute pharyngitis. Lungs are clear bilaterally.  I do not hear any wheezing or rales.  The patient is breathing comfortably with no significant increase  in respiratory rate or effort at the time of my exam.  There is no reproducible chest wall tenderness to palpation over the ribs or over the sternum.  No vesicular rash is noted to the torso. Heart has a regular rate and rhythm.  He is not tachycardic. The abdomen is soft, nondistended, and nontender to generalized palpation including the epigastrium and RUQ areas.  No masses or peritoneal signs are noted.   Extremities are benign with no evidence of acute injury.  There is no lower extremity edema noted.   There are no focal or lateralizing neurologic deficits on exam.  DIAGNOSTICS:   Imaging: CTA CHEST W & W/O IV CONTRAST  Final Result  Impression: Mild dependent atelectasis or scarring in the lower lobes. Nonobstructive nephrolithiasis.        Reading Doctor: Rogene Rush  Electronic Signature by: Rogene Rush SPRINKLE CHEST 1 VIEW  Final Result  IMPRESSION:    No evidence for acute cardiopulmonary process.        Reading Doctor: Cleatus Setter  Electronic Signature by: Cleatus Setter      Labs: Labs Reviewed  BASIC METABOLIC PANEL - Abnormal; Notable for the following components:      Result Value   Chloride 110 (*)    All other components within normal limits  CBC WITH DIFFERENTIAL - Abnormal; Notable for the following components:   Absolute Immature Granulocytes (#) 0.03 (*)     All other components within normal limits  TROPONIN I - Normal  D-DIMER - Normal  TROPONIN I - Normal  (PANEL)-CBC WITH DIFFERENTIAL   Narrative:    The following orders were created for panel order (PANEL)-CBC WITH DIFFERENTIAL. Procedure                               Abnormality         Status                    ---------                               -----------         ------                    CBC WITH DIFFERENTIAL[[id number]]        Abnormal            Final result               Please view results for these tests on the individual orders.    EKG Results for orders placed or performed during the hospital encounter of 01/06/23 (from the past 72 hour(s))  EKG 12 LEAD   Collection Time: 01/06/23  2:22 PM  Test Value Low-High   EKG Text       - ABNORMAL ECG - Sinus bradycardia rate<60 Left anterior fascicular block axis(240,-40), init forces inf    Heart Rate 55    P-R Interval 165    P Axis 52    QRS Duration 92    QT Internal 406    QTcB 389    QRS Axis -55    I-40 Axis 22    T-40 Axis -71    T Wave Axis 39    ST Axis 81       ED COURSE AND MEDICAL DECISION MAKING:  The patient was not febrile here and his vital signs were unremarkable including an O2 sat of 98% on room air.  He came in for evaluation of recurring bouts of sharp chest pain that started in the front near the sternum and go through to his back.  These last for only a split second or 2 at a time but for recurring periodically, although they have decreased in severity and frequency from how they were at onset.  He does have a history of a PE, and ascending aortic aneurysm,  and CAD.  His initial EKG has a sinus bradycardia on it and there were no acute ischemic ST or T wave changes noted at all.  Chest x-ray was clear and did not show evidence of pneumonia on it, pneumothorax, or CHF.  CBC has no leukocytosis or left shift on the differential and a normal H&H.  BMP is unremarkable.  D-dimer is normal.  Serum  troponin is less than 0.03.  Although the patient is doing better now, he does have a history of an ascending aortic aneurysm and I think that a CTA of the chest is necessary to investigate whether some complication from this could be causing his pains.  The pain is certainly atypical to be related to coronary artery disease and a recent negative cardiac catheterization is certainly reassuring in that regard.  The CTA did not show evidence of a significant aneurysm or dissection, there was no PE, there was no pericardial effusion or pleural effusion, and no pneumothorax was identified.  The patient remained very stable in the ER waiting all of the studies and results.  He said he had a couple of brief episodes of the discomfort while he was here, but it was not nearly as bad as it had been at home and resolved without requiring any treatment.  I have not found any abnormality on his workup tonight to explain the symptoms and that things have been identified that would require admission or urgent intervention.  I feel confident that we evaluated him for the life-threatening entities that could be causing the symptoms and none were identified.  The patient feels comfortable with the workup as well and we both felt okay to let him go home.  He did not want any pain medication because he said the pain comes and goes so fast that he would not even have time to take it before it is gone.  It is much better now anyway than it was before.  I cautioned him to follow-up with his primary care provider for reevaluation if his symptoms are persistent or he can certainly return here if he gets significantly worse again.  Differential Diagnosis:   Old records reviewed.   Medications - No data to display      DIAGNOSIS:    ICD-10-CM  1. Atypical chest pain  R07.89    DISCHARGE MEDS  Discharge Medication List as of 01/06/2023  6:42 PM     CONTINUE these medications which have NOT CHANGED   Details   ALPRAZolam (XANAX) 0.5 mg Oral Tablet Take 1 Tablet by mouth three times a day as needed for Anxiety. PT STATES HE TAKES 1/2 TAB QAM PRN, Historical Med    aspirin (Aspir-81) 81 mg Oral Tablet, Delayed Release (E.C.) Take 1 Tablet by mouth daily., Disp-100 Tablet, R-3, Normal    atorvastatin (LIPITOR) 80 mg Oral Tablet Take 1 Tablet by mouth at bedtime., Historical Med    Coenzyme Q10 200 mg Oral Capsule  Take 1 Capsule by mouth daily., Historical Med    famotidine (PEPCID) 40 mg Oral Tablet Take 1 Tablet by mouth twice a day as needed (reflux)., Historical Med    fluticasone propionate (FLONASE) 50 mcg/actuation Nasal Spray, Suspension 1 Spray by Nasal route twice a day as needed for Congestion., Historical Med    KRILL OIL ORAL Take 1 Tablet by mouth daily., Historical Med    losartan (COZAAR) 50 mg Oral Tablet Take 0.5 Tablets by mouth twice a day., Historical Med    metoprolol succinate XL (TOPROL-XL) 50 mg Oral Tablet Sustained Release 24HR Take 1 Tablet by mouth every morning., Historical Med    nitroglycerin (NITROSTAT) 0.4 mg Sublingual Tablet, Sublingual Take 1 Tablet under tongue every 5 minutes as needed for Chest pain., Historical Med    potassium citrate (UROCIT-K) 15 mEq Oral Tablet Sustained Release Take 1 Tablet by mouth every evening., Historical Med    RABEprazole (ACIPHEX) 20 mg Oral Tablet, Delayed Release (E.C.) Take 1 Tablet by mouth every morning., Historical Med    sucralfate (CARAFATE) 1 gram Oral Tablet Take 1 Tablet by mouth as needed., Historical Med    verapamil ER (VERELAN) 120 mg Oral Cap, 24HR Sust Release Pellets Take 1 Capsule by mouth every evening., Historical Med    Xarelto 10 mg Oral Tablet Take 1 Tablet by mouth every evening., DAW, Historical Med        FOLLOW UP:  Gary Dade, MD [address] [place] [address] [phone]   Call for a follow-up appointment with your primary care provider if you are having ongoing  symptoms.

## 2023-06-06 NOTE — ED Provider Notes (Signed)
 EMERGENCY MEDICINE  CHIEF COMPLAINT: Chief Complaint  Patient presents with  . Flank Pain    R flank pain with R groin radiation. Pt thinks has a kidney stone. Has hx. Denies issues with urination. Accompanying nausea.     HPI: Bobby Evans is a 68yrs Male presents to the emergency department with several hours of right flank pain with radiation towards the inguinal area.  He has had mild nausea as well.  He noted darker urine.  There is a history of renal stone disease with prior lithotripsy and stone extractions in the past.  PAST MEDICAL HISTORY: Past Medical History[1]  PAST SURGICAL HISTORY:  has a past surgical history that includes carotid stent placement; sur - cardiac; and sur - abdominal surgery shx.  MEDICATIONS: Prior to Admission medications   Medication Sig  ALPRAZolam (XANAX) 0.5 mg Oral Tablet Take 1 Tablet by mouth three times a day as needed for Anxiety. PT STATES HE TAKES 1/2 TAB QAM PRN  aspirin (Aspir-81) 81 mg Oral Tablet, Delayed Release (E.C.) Take 1 Tablet by mouth daily.  atorvastatin (LIPITOR) 80 mg Oral Tablet Take 1 Tablet by mouth at bedtime.  Coenzyme Q10 200 mg Oral Capsule Take 1 Capsule by mouth daily.  famotidine (PEPCID) 40 mg Oral Tablet Take 1 Tablet by mouth twice a day as needed (reflux).  fluticasone propionate (FLONASE) 50 mcg/actuation Nasal Spray, Suspension 1 Spray by Nasal route twice a day as needed for Congestion.  KRILL OIL ORAL Take 1 Tablet by mouth daily.  losartan (COZAAR) 50 mg Oral Tablet Take 0.5 Tablets by mouth twice a day.  metoprolol succinate XL (TOPROL-XL) 50 mg Oral Tablet Sustained Release 24HR Take 1 Tablet by mouth every morning.  nitroglycerin (NITROSTAT) 0.4 mg Sublingual Tablet, Sublingual Take 1 Tablet under tongue every 5 minutes as needed for Chest pain.  potassium citrate (UROCIT-K) 15 mEq Oral Tablet Sustained Release Take 1 Tablet by mouth every evening.  RABEprazole (ACIPHEX) 20 mg Oral Tablet, Delayed  Release (E.C.) Take 1 Tablet by mouth every morning.  sucralfate (CARAFATE) 1 gram Oral Tablet Take 1 Tablet by mouth as needed.  verapamil ER (VERELAN) 120 mg Oral Cap, 24HR Sust Release Pellets Take 1 Capsule by mouth every evening.  Xarelto 10 mg Oral Tablet Take 1 Tablet by mouth every evening.     ALLERGIES: Allergies[2]  FAMILY HISTORY: Family History[3]  SOCIAL HISTORY: Social History   Tobacco Use  . Smoking status: Every Day    Current packs/day: 0.50    Types: Cigarettes  . Smokeless tobacco: Never  Substance Use Topics  . Alcohol use: Not Currently    REVIEW OF SYMPTOMS: Review of Systems Neurologic: Normal appetite, no weakness Head: No headache Eyes: No visual disturbance Pharynx: No sore throat Neck: No swelling or pain Lungs: No shortness of breath or coughing Heart: No chest pain Abdomen: (+) abdominal pain, (+) nausea, (-) vomiting, or diarrhea Extremities: No swelling or pain Neuro: No motor weakness or sensory Skin: No rash   PHYSICAL EXAM: Vitals:   06/06/23 1042  BP: (!) 152/86  Pulse: 62  Resp: 18  Temp: 36.4 C (97.6 F)  SpO2: 97%  Weight: 68 kg (150 lb)  Height: 1.676 m (5' 6)   GENERAL: NAD HEENT: Coldiron/at, perla, eomi, pharynx clear NECK: supple, no jvd, trachea midline LUNGS: non labored respirations, no audible wheezing or visible retractions HEART: rrr, no thrill ABD: Mild right lower quadrant tenderness.  Tender, no rebound, no guarding, bs +.  No  costovertebral angle tenderness EXT: no c/c/e NEURO: aaox3, mae, stregnth 5/5, sensation intact, cn 2-12 wnl SKIN: warm, dry VASC: pulses 2+ PSYCH: no si, hi, or psychosis   DIAGNOSTICS:   Imaging: CT ABDOMEN PELVIS W/O IV CONTRAST    (Results Pending)    Labs: Labs Reviewed  CBC WITH DIFFERENTIAL - Abnormal; Notable for the following components:      Result Value   Absolute Immature Granulocytes (#) 0.02 (*)    All other components within normal limits  URINALYSIS,  COMPLETE - Abnormal; Notable for the following components:   Color, Urine Brown (*)    Clarity, Urine Turbid (*)    Hemoglobin, Urine 3+ (*)    Leukocyte Esterase, Urine Trace (*)    Protein, Urine 1+ (*)    RBC, Urine >182 (*)    WBC, Urine 10 (*)    Squamous Epithelial Cells, Urine Few (*)    All other components within normal limits  (PANEL)-CBC WITH DIFFERENTIAL   Narrative:    The following orders were created for panel order (PANEL)-CBC WITH DIFFERENTIAL. Procedure                               Abnormality         Status                    ---------                               -----------         ------                    CBC WITH DIFFERENTIAL[[id number]]        Abnormal            Final result               Please view results for these tests on the individual orders.  COMPREHENSIVE METABOLIC PANEL  MAGNESIUM  (PANEL)-URINALYSIS, COMPLETE   Narrative:    The following orders were created for panel order (PANEL)-URINALYSIS, COMPLETE. Procedure                               Abnormality         Status                    ---------                               -----------         ------                    URINALYSIS, COMPLETE[[id number]]         Abnormal            Final result               Please view results for these tests on the individual orders.    EKG No results found for this or any previous visit (from the past 72 hour(s)).    ED COURSE AND MEDICAL DECISION MAKING: CT imaging shows a 5.5 mm stone at the mid ureteral level.  There is some associated hydronephrosis.  The patient has microscopic hematuria.  We will  add Flomax  as he already has Flomax  tablets at home on his prior episodes we will give him analgesia encourage increased fluids.  He has a urologist at [facility] which we will encourage him to consult for follow-up..   Differential Diagnosis:   Old records reviewed.   Medications  sodium chloride  0.9 % bolus 1,000 mL (has no  administration in time range)  fentaNYL (SUBLIMAZE) injection 50 mcg (has no administration in time range)  ondansetron  (PF) (ZOFRAN ) injection 4 mg (has no administration in time range)       DIAGNOSIS:  No diagnosis found.  DISCHARGE MEDS  Patient's Medications  New Prescriptions   No medications on file  Previous Medications   ALPRAZOLAM (XANAX) 0.5 MG ORAL TABLET    Take 1 Tablet by mouth three times a day as needed for Anxiety. PT STATES HE TAKES 1/2 TAB QAM PRN   ASPIRIN (ASPIR-81) 81 MG ORAL TABLET, DELAYED RELEASE (E.C.)    Take 1 Tablet by mouth daily.   ATORVASTATIN (LIPITOR) 80 MG ORAL TABLET    Take 1 Tablet by mouth at bedtime.   COENZYME Q10 200 MG ORAL CAPSULE    Take 1 Capsule by mouth daily.   FAMOTIDINE (PEPCID) 40 MG ORAL TABLET    Take 1 Tablet by mouth twice a day as needed (reflux).   FLUTICASONE PROPIONATE (FLONASE) 50 MCG/ACTUATION NASAL SPRAY, SUSPENSION    1 Spray by Nasal route twice a day as needed for Congestion.   KRILL OIL ORAL    Take 1 Tablet by mouth daily.   LOSARTAN (COZAAR) 50 MG ORAL TABLET    Take 0.5 Tablets by mouth twice a day.   METOPROLOL SUCCINATE XL (TOPROL-XL) 50 MG ORAL TABLET SUSTAINED RELEASE 24HR    Take 1 Tablet by mouth every morning.   NITROGLYCERIN (NITROSTAT) 0.4 MG SUBLINGUAL TABLET, SUBLINGUAL    Take 1 Tablet under tongue every 5 minutes as needed for Chest pain.   POTASSIUM CITRATE (UROCIT-K) 15 MEQ ORAL TABLET SUSTAINED RELEASE    Take 1 Tablet by mouth every evening.   RABEPRAZOLE (ACIPHEX) 20 MG ORAL TABLET, DELAYED RELEASE (E.C.)    Take 1 Tablet by mouth every morning.   SUCRALFATE (CARAFATE) 1 GRAM ORAL TABLET    Take 1 Tablet by mouth as needed.   VERAPAMIL ER (VERELAN) 120 MG ORAL CAP, 24HR SUST RELEASE PELLETS    Take 1 Capsule by mouth every evening.   XARELTO 10 MG ORAL TABLET    Take 1 Tablet by mouth every evening.  Modified Medications   No medications on file  Discontinued Medications   No medications on  file    FOLLOW UP:  No follow-up provider specified.          [1] Past Medical History: Diagnosis Date  . Cancer (CMS/HCC)   . Hypertension   . TIA (transient ischemic attack)   [2] Allergies Allergen Reactions  . Aspirin Hives and Nausea and Vomiting    Large   doses of aspirin cause severe GERD. Large   doses of aspirin cause severe GERD. Large   doses of aspirin cause severe GERD. Large   doses of aspirin cause severe GERD. NON COATED  Other reaction(s): severe stomach problems  . Cephalexin Anaphylaxis and Rash    Other reaction(s): anaphylactic shock, cephalexin ([id number])  . Other Allergy Diarrhea    Lactose intolerant Lactose intolerant  . Pantoprazole Hives and Rash    Other reaction(s): hives, rash  . Codeine  Nausea Only and Rash    Other reaction(s): Other (See Comments), tingly, wired, cant sleep or rest Abdominal pain hyperactivity hyperactivity   . Chlorthalidone Itching  . Eliquis [Apixaban] Rash  . Lactose     Other reaction(s): Abdominal Pain, stomach issues  . Pollen Extracts     Other reaction(s): Unknown  [3] Family History Problem Relation Name Age of Onset  . Breast Cancer Mother

## 2024-02-23 ENCOUNTER — Emergency Department
Admission: EM | Admit: 2024-02-23 | Discharge: 2024-02-23 | Disposition: A | Discharge status: Home / Self Care | Payer: PRIVATE HEALTH INSURANCE

## 2024-02-23 ENCOUNTER — Other Ambulatory Visit: Payer: Self-pay

## 2024-02-23 ENCOUNTER — Emergency Department: Payer: PRIVATE HEALTH INSURANCE

## 2024-02-23 DIAGNOSIS — I10 Essential (primary) hypertension: Secondary | ICD-10-CM | POA: Diagnosis not present

## 2024-02-23 DIAGNOSIS — E119 Type 2 diabetes mellitus without complications: Secondary | ICD-10-CM | POA: Insufficient documentation

## 2024-02-23 DIAGNOSIS — I2510 Atherosclerotic heart disease of native coronary artery without angina pectoris: Secondary | ICD-10-CM | POA: Diagnosis not present

## 2024-02-23 DIAGNOSIS — T675XXA Heat exhaustion, unspecified, initial encounter: Secondary | ICD-10-CM | POA: Insufficient documentation

## 2024-02-23 DIAGNOSIS — R55 Syncope and collapse: Secondary | ICD-10-CM | POA: Insufficient documentation

## 2024-02-23 LAB — CBC WITH DIFFERENTIAL/PLATELET
Abs Immature Granulocytes: 0.04 K/uL (ref 0.00–0.07)
Basophils Absolute: 0 K/uL (ref 0.0–0.1)
Basophils Relative: 1 %
Eosinophils Absolute: 0.1 K/uL (ref 0.0–0.5)
Eosinophils Relative: 1 %
HCT: 45.1 % (ref 39.0–52.0)
Hemoglobin: 15 g/dL (ref 13.0–17.0)
Immature Granulocytes: 1 %
Lymphocytes Relative: 19 %
Lymphs Abs: 1.5 K/uL (ref 0.7–4.0)
MCH: 29.8 pg (ref 26.0–34.0)
MCHC: 33.3 g/dL (ref 30.0–36.0)
MCV: 89.7 fL (ref 80.0–100.0)
Monocytes Absolute: 0.7 K/uL (ref 0.1–1.0)
Monocytes Relative: 10 %
Neutro Abs: 5.1 K/uL (ref 1.7–7.7)
Neutrophils Relative %: 68 %
Platelets: 230 K/uL (ref 150–400)
RBC: 5.03 MIL/uL (ref 4.22–5.81)
RDW: 13.9 % (ref 11.5–15.5)
WBC: 7.5 K/uL (ref 4.0–10.5)
nRBC: 0 % (ref 0.0–0.2)

## 2024-02-23 LAB — COMPREHENSIVE METABOLIC PANEL WITH GFR
ALT: 16 U/L (ref 0–44)
AST: 18 U/L (ref 15–41)
Albumin: 3.5 g/dL (ref 3.5–5.0)
Alkaline Phosphatase: 50 U/L (ref 38–126)
Anion gap: 9 (ref 5–15)
BUN: 23 mg/dL (ref 8–23)
CO2: 22 mmol/L (ref 22–32)
Calcium: 7.9 mg/dL — ABNORMAL LOW (ref 8.9–10.3)
Chloride: 106 mmol/L (ref 98–111)
Creatinine, Ser: 1.31 mg/dL — ABNORMAL HIGH (ref 0.61–1.24)
GFR, Estimated: 57 mL/min — ABNORMAL LOW (ref >60)
Glucose, Bld: 74 mg/dL (ref 70–99)
Potassium: 3.4 mmol/L — ABNORMAL LOW (ref 3.5–5.1)
Sodium: 137 mmol/L (ref 135–145)
Total Bilirubin: 1.6 mg/dL — ABNORMAL HIGH (ref 0.0–1.2)
Total Protein: 5.7 g/dL — ABNORMAL LOW (ref 6.5–8.1)

## 2024-02-23 LAB — URINALYSIS, ROUTINE W REFLEX MICROSCOPIC
Bilirubin Urine: NEGATIVE
Glucose, UA: NEGATIVE mg/dL
Hgb urine dipstick: NEGATIVE
Ketones, ur: 20 mg/dL — AB
Leukocytes,Ua: NEGATIVE
Nitrite: NEGATIVE
Protein, ur: NEGATIVE mg/dL
Specific Gravity, Urine: 1.01 (ref 1.005–1.030)
pH: 7 (ref 5.0–8.0)

## 2024-02-23 LAB — CK: Total CK: 58 U/L (ref 49–397)

## 2024-02-23 MED ORDER — CALCIUM GLUCONATE-NACL 1-0.675 GM/50ML-% IV SOLN
1.0000 g | Freq: Once | INTRAVENOUS | Status: AC
  Administered 2024-02-23: 1000 mg via INTRAVENOUS
  Filled 2024-02-23: qty 50

## 2024-02-23 MED ORDER — SODIUM CHLORIDE 0.9 % IV BOLUS
1000.0000 mL | Freq: Once | INTRAVENOUS | Status: AC
  Administered 2024-02-23: 1000 mL via INTRAVENOUS

## 2024-02-23 NOTE — ED Provider Notes (Signed)
 [facility] Provider Note    Event Date/Time   First MD Initiated Contact with Patient 02/23/24 1916     (approximate)   History   Near Syncope   HPI  Bobby Evans is a 73 y.o. male  wih CAD, HTN on chronic Xanax, DM II, stroke, HLD who presents for lightheadedness and nausea and the feeling as if he is getting close to passing out for the past several hours.  Patient reports that for the past 48 hours he has been working outside putting in an irrigation system with the [employer].  He reports that he may have overheated himself and felt lightheaded and as if he would pass out but did not actually lose consciousness.  He reports dark urine.  He denies any fevers or chills, chest pain shortness of breath or changes in bowel habits.  He is compliant with all of his medications.  He tells me that he has a CT scan scheduled on Monday to figure out a possible pancreas issue but denies any acutely worsening abdominal pain from baseline      Physical Exam   Triage Vital Signs: ED Triage Vitals  Encounter Vitals Group     BP      Girls Systolic BP Percentile      Girls Diastolic BP Percentile      Boys Systolic BP Percentile      Boys Diastolic BP Percentile      Pulse      Resp      Temp      Temp src      SpO2      Weight      Height      Head Circumference      Peak Flow      Pain Score      Pain Loc      Pain Education      Exclude from Growth Chart     Most recent vital signs: Vitals:   02/23/24 2230 02/23/24 2300  BP: 120/68 129/82  Pulse: 64   Resp: 18 17  Temp:    SpO2: 97% 100%    Nursing Triage Note reviewed. Vital signs reviewed and patients oxygen saturation is normoxic General: Patient is well nourished, well developed, awake and alert, resting comfortably in no acute distress, appears to be sun exposed Head: Normocephalic and atraumatic Eyes: Normal inspection, extraocular muscles intact, no conjunctival pallor Ear, nose,  throat: Normal external exam Very dry mucous membranes Neck: Normal range of motion Respiratory: Patient is in no respiratory distress, lungs CTAB Cardiovascular: Patient is not tachycardic, RR GI: Abd SNT with no guarding or rebound  Back: Normal inspection of the back with good strength and range of motion throughout all ext Extremities: pulses intact with good cap refills, no LE pitting edema or calf tenderness Neuro: The patient is alert and oriented to person, place, and time, appropriately conversive, with 5/5 bilat UE/LE strength, no gross motor or sensory defects noted. Coordination appears to be adequate. Skin: Warm, dry, and intact Psych: normal mood and affect, no SI or HI  ED Results / Procedures / Treatments   Labs (all labs ordered are listed, but only abnormal results are displayed) Labs Reviewed  COMPREHENSIVE METABOLIC PANEL WITH GFR - Abnormal; Notable for the following components:      Result Value   Potassium 3.4 (*)    Creatinine, Ser 1.31 (*)    Calcium  7.9 (*)    Total Protein  5.7 (*)    Total Bilirubin 1.6 (*)    GFR, Estimated 57 (*)    All other components within normal limits  URINALYSIS, ROUTINE W REFLEX MICROSCOPIC - Abnormal; Notable for the following components:   Color, Urine YELLOW (*)    APPearance CLEAR (*)    Ketones, ur 20 (*)    All other components within normal limits  CBC WITH DIFFERENTIAL/PLATELET  CK     EKG EKG and rhythm strip are interpreted by myself:   EKG: [Normal sinus rhythm] at heart rate of 70, normal QRS duration, QTc 450, normal oST segments and T waves no ectopy EKG not consistent with Acute STEMI Rhythm strip: NSR in lead II   RADIOLOGY US  abdomen RUQ: No acute cholecystitis on my independent review interpretation radiologist agrees    PROCEDURES:  Critical Care performed: No  Procedures   MEDICATIONS ORDERED IN ED: Medications  sodium chloride  0.9 % bolus 1,000 mL (0 mLs Intravenous Stopped 02/23/24  2301)  calcium  gluconate 1 g/ 50 mL sodium chloride  IVPB (0 mg Intravenous Stopped 02/23/24 2315)     IMPRESSION / MDM / ASSESSMENT AND PLAN / ED COURSE                                Differential diagnosis includes, but is not limited to, heat exhaustion, anemia, rhabdomyolysis, UTI, arrhythmia, cholecystitis, acute renal insufficiency  ED course: Patient appears sun exposed and hypovolemic.  EKG demonstrated no acute ischemia or arrhythmia.  He does have a mild acute renal insufficiency and was given 1 L of IV fluid.  He was also noted to have a slightly low calcium  and this was repleted.  He did have an elevated bilirubin so decision made to avoid CT imaging with contrast given his AKI obtain a right upper quadrant ultrasound which demonstrated no acute abnormalities.  His CK was not elevated.  Patient reported improvement in symptoms and was able to ambulate around the unit without any lightheadedness.  He was counseled to avoid the sun for the next 72 hours and to rest and hydrate which he voiced understanding.  He will follow-up with his primary care physician on Monday   Clinical Course as of 02/23/24 2345  Fri Feb 23, 2024  2022 CBC with Differential No anemia or leukocytosis [HD]  2022 Urinalysis, Routine w reflex microscopic -Urine, Clean Catch(!) Patient has a copious amount of ketones but no protein [HD]  2049 Creatinine(!): 1.31 Only very mild acute renal insufficiency [HD]  2049 Total Bilirubin(!): 1.6 Elevated but patient denies any abdominal pain and abdominal pain is completely benign [HD]  2049 Calcium (!): 7.9 Will replete [HD]  2059 Patient reassessed and feels improved.  He tells me that he is scheduled to have [HD]  2146 US  Abdomen Limited Unremarkable will discharge shortly [HD]    Clinical Course User Index [HD] Nicholaus Rolland BRAVO, MD   At time of discharge there is no evidence of acute life, limb, vision, or fertility threat. Patient has stable vital signs, pain  is well controlled, patient is ambulatory and p.o. tolerant.  Discharge instructions were completed using the [employer] system. I would refer you to those at this time. All warnings prescriptions follow-up etc. were discussed in detail with the patient. Patient indicates understanding and is agreeable with this plan. All questions answered.  Patient is made aware that they may return to the emergency department for any worsening or new condition  or for any other emergency.   FINAL CLINICAL IMPRESSION(S) / ED DIAGNOSES   Final diagnoses:  Near syncope  Heat exhaustion, initial encounter     Rx / DC Orders   ED Discharge Orders          Ordered    Ambulatory Referral to Primary Care (Establish Care)        02/23/24 2159             Note:  This document was prepared using Dragon voice recognition software and may include unintentional dictation errors.   Nicholaus Rolland BRAVO, MD 02/23/24 [id number]

## 2024-02-23 NOTE — Discharge Instructions (Signed)
 You were seen in the emergency department for near syncope in the setting of likely heat exhaustion.  Workup today was reassuring.  You were a little bit dry and had a low calcium  which was repleted.  For the next 72 hours please avoid the sun and rest and hydrate.  Ensure adequate hydration (your urine should be clear).  Return with any acutely worsening symptoms or any other emergency.  It was very nice meeting you and I wish you the best of luck with everything. Rolland Moats, MD, PhD

## 2024-02-23 NOTE — ED Triage Notes (Signed)
 BIB [facility] for near syncope after working outside all day. Patient states he started feeling lightheaded earlier in the day with position changes, but it seemed to get worse throughout the day. Patient denies LOC or an head injuries. Patient states he was drinking water, but his urine still seemed concentrated.
